# Patient Record
Sex: Female | Born: 1995 | Race: Black or African American | Hispanic: No | Marital: Single
Health system: Southern US, Community
[De-identification: ages and names within clinical notes are randomized; demographics above are authoritative.]

---

## 2018-04-02 ENCOUNTER — Emergency Department (HOSPITAL_COMMUNITY): Payer: Self-pay

## 2018-04-02 ENCOUNTER — Emergency Department (HOSPITAL_COMMUNITY)
Admission: EM | Admit: 2018-04-02 | Discharge: 2018-04-02 | Disposition: A | Discharge status: Home / Self Care | Payer: Self-pay | Attending: Emergency Medicine | Admitting: Emergency Medicine

## 2018-04-02 ENCOUNTER — Encounter (HOSPITAL_COMMUNITY): Payer: Self-pay | Admitting: Emergency Medicine

## 2018-04-02 ENCOUNTER — Other Ambulatory Visit: Payer: Self-pay

## 2018-04-02 DIAGNOSIS — Z23 Encounter for immunization: Secondary | ICD-10-CM | POA: Insufficient documentation

## 2018-04-02 DIAGNOSIS — Y929 Unspecified place or not applicable: Secondary | ICD-10-CM | POA: Insufficient documentation

## 2018-04-02 DIAGNOSIS — Y999 Unspecified external cause status: Secondary | ICD-10-CM | POA: Insufficient documentation

## 2018-04-02 DIAGNOSIS — S0181XA Laceration without foreign body of other part of head, initial encounter: Secondary | ICD-10-CM | POA: Insufficient documentation

## 2018-04-02 DIAGNOSIS — S0083XA Contusion of other part of head, initial encounter: Secondary | ICD-10-CM | POA: Insufficient documentation

## 2018-04-02 DIAGNOSIS — Y939 Activity, unspecified: Secondary | ICD-10-CM | POA: Insufficient documentation

## 2018-04-02 LAB — I-STAT BETA HCG BLOOD, ED (MC, WL, AP ONLY): I-stat hCG, quantitative: <5 m[IU]/mL (ref <5)

## 2018-04-02 MED ORDER — TETANUS-DIPHTH-ACELL PERTUSSIS 5-2.5-18.5 LF-MCG/0.5 IM SUSP
0.5000 mL | Freq: Once | INTRAMUSCULAR | Status: AC
  Administered 2018-04-02: 0.5 mL via INTRAMUSCULAR
  Filled 2018-04-02: qty 0.5

## 2018-04-02 MED ORDER — BACITRACIN ZINC 500 UNIT/GM EX OINT
TOPICAL_OINTMENT | CUTANEOUS | Status: AC
  Administered 2018-04-02: 23:00:00
  Filled 2018-04-02: qty 0.9

## 2018-04-02 MED ORDER — LIDOCAINE HCL (PF) 1 % IJ SOLN
10.0000 mL | Freq: Once | INTRAMUSCULAR | Status: AC
  Administered 2018-04-02: 10 mL
  Filled 2018-04-02: qty 30

## 2018-04-02 MED ORDER — HYDROCODONE-ACETAMINOPHEN 5-325 MG PO TABS
1.0000 | ORAL_TABLET | ORAL | Status: AC
  Administered 2018-04-02: 1 via ORAL
  Filled 2018-04-02: qty 1
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
## 2018-04-02 NOTE — ED Notes (Signed)
Spoke with Sane RN Melissa and she is on the way to speak with pt.

## 2018-04-02 NOTE — ED Notes (Signed)
Sane RN at bedside.

## 2018-04-02 NOTE — Discharge Instructions (Signed)
Suture removal in 5 days, apply ice to help with the swelling, take over the counter medications as needed for pain
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
## 2018-04-02 NOTE — ED Notes (Signed)
Pt verbalized discharge instructions and follow up care. SANE RN arranged all transportation for patient. Pt ambulatory and alert.

## 2018-04-02 NOTE — ED Provider Notes (Signed)
..  Laceration Repair Date/Time: 04/02/2018 10:03 PM Performed by: Versie StarksSeawell, Brittnie Lewey A, DO Authorized by: Linwood DibblesKnapp, Jon, MD   Consent:    Consent obtained:  Verbal   Consent given by:  Patient   Risks discussed:  Infection, poor cosmetic result and pain Anesthesia (see MAR for exact dosages):    Anesthesia method:  Local infiltration   Local anesthetic:  Lidocaine 1% WITH epi Laceration details:    Location:  Face   Face location:  L eyebrow   Length (cm):  2.5   Depth (mm):  3 Repair type:    Repair type:  Simple Pre-procedure details:    Preparation:  Patient was prepped and draped in usual sterile fashion Treatment:    Area cleansed with:  Betadine and saline   Amount of cleaning:  Standard   Irrigation solution:  Sterile saline Skin repair:    Repair method:  Sutures   Suture size:  6-0   Suture material:  Nylon   Suture technique:  Simple interrupted Approximation:    Approximation:  Close Post-procedure details:    Dressing:  Open (no dressing)   Patient tolerance of procedure:  Tolerated well, no immediate complications   Sage Kopera A, DO 04/02/2018, 10:07 PM Pager: [phone]     Versie StarksSeawell, Frutoso Dimare A, DO 04/02/18 2208    Linwood DibblesKnapp, Jon, MD 04/02/18 2351
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
## 2018-04-03 NOTE — SANE Note (Signed)
Follow-up Phone Call  Patient gives verbal consent for a FNE/SANE follow-up phone call in 48-72 hours: no Patient's telephone number: n/a Patient gives verbal consent to leave voicemail at the phone number listed above:No DO NOT CALL between the hours of: no consent to call
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
## 2020-11-14 ENCOUNTER — Inpatient Hospital Stay: Admit: 2020-11-14 | Discharge: 2020-11-14 | Disposition: A | Discharge status: Home / Self Care | Payer: MEDICAID

## 2020-11-14 ENCOUNTER — Emergency Department: Admit: 2020-11-14 | Payer: MEDICAID

## 2020-11-14 DIAGNOSIS — R1031 Right lower quadrant pain: Secondary | ICD-10-CM

## 2020-11-14 LAB — CBC WITH AUTO DIFFERENTIAL
Basophils %: 0.7 %
Basophils Absolute: 0 10*3/uL (ref 0.0–0.2)
Eosinophils %: 1.7 %
Eosinophils Absolute: 0.1 10*3/uL (ref 0.0–0.6)
Hematocrit: 38.8 % (ref 36.0–48.0)
Hemoglobin: 12.6 g/dL (ref 12.0–16.0)
Lymphocytes %: 35.2 %
Lymphocytes Absolute: 2 10*3/uL (ref 1.0–5.1)
MCH: 25.8 pg — ABNORMAL LOW (ref 26.0–34.0)
MCHC: 32.5 g/dL (ref 31.0–36.0)
MCV: 79.4 fL — ABNORMAL LOW (ref 80.0–100.0)
MPV: 8.3 fL (ref 5.0–10.5)
Monocytes %: 10.8 %
Monocytes Absolute: 0.6 10*3/uL (ref 0.0–1.3)
Neutrophils %: 51.6 %
Neutrophils Absolute: 2.9 10*3/uL (ref 1.7–7.7)
Platelets: 303 10*3/uL (ref 135–450)
RBC: 4.88 M/uL (ref 4.00–5.20)
RDW: 15.1 % (ref 12.4–15.4)
WBC: 5.6 10*3/uL (ref 4.0–11.0)

## 2020-11-14 LAB — URINALYSIS WITH REFLEX TO CULTURE
Bilirubin Urine: NEGATIVE
Glucose, Ur: NEGATIVE mg/dL
Ketones, Urine: NEGATIVE mg/dL
Nitrite, Urine: NEGATIVE
Protein, UA: 30 mg/dL — AB
Specific Gravity, UA: 1.021 (ref 1.005–1.030)
Urobilinogen, Urine: 0.2 E.U./dL (ref <2.0)
pH, UA: 6 (ref 5.0–8.0)

## 2020-11-14 LAB — HEPATIC FUNCTION PANEL
ALT: 17 U/L (ref 10–40)
AST: 12 U/L — ABNORMAL LOW (ref 15–37)
Albumin: 3.2 g/dL — ABNORMAL LOW (ref 3.4–5.0)
Alkaline Phosphatase: 85 U/L (ref 40–129)
Bilirubin, Direct: <0.2 mg/dL (ref 0.0–0.3)
Total Bilirubin: <0.2 mg/dL (ref 0.0–1.0)
Total Protein: 7.3 g/dL (ref 6.4–8.2)

## 2020-11-14 LAB — MICROSCOPIC URINALYSIS
Bacteria, UA: NONE SEEN /HPF
Epithelial Cells, UA: 8 /HPF — ABNORMAL HIGH (ref 0–5)
Hyaline Casts, UA: 0 /LPF (ref 0–8)
RBC, UA: 8 /HPF — ABNORMAL HIGH (ref 0–4)
WBC, UA: 5 /HPF (ref 0–5)

## 2020-11-14 LAB — BASIC METABOLIC PANEL
Anion Gap: 11 (ref 3–16)
BUN: 13 mg/dL (ref 7–20)
CO2: 22 mmol/L (ref 21–32)
Calcium: 9 mg/dL (ref 8.3–10.6)
Chloride: 103 mmol/L (ref 99–110)
Creatinine: 0.8 mg/dL (ref 0.6–1.1)
GFR African American: >60 (ref >60)
GFR Non-African American: >60 (ref >60)
Glucose: 98 mg/dL (ref 70–99)
Potassium: 4.2 mmol/L (ref 3.5–5.1)
Sodium: 136 mmol/L (ref 136–145)

## 2020-11-14 LAB — LIPASE: Lipase: 16 U/L (ref 13.0–60.0)

## 2020-11-14 LAB — PREGNANCY, URINE: HCG(Urine) Pregnancy Test: NEGATIVE

## 2020-11-14 MED ORDER — ONDANSETRON HCL 4 MG/2ML IJ SOLN
4 MG/2ML | Freq: Once | INTRAMUSCULAR | Status: DC
Start: 2020-11-14 — End: 2020-11-14

## 2020-11-14 MED ORDER — SODIUM CHLORIDE 0.9 % IV BOLUS
0.9 % | Freq: Once | INTRAVENOUS | Status: AC
Start: 2020-11-14 — End: 2020-11-14
  Administered 2020-11-14: 15:00:00 1000 mL via INTRAVENOUS

## 2020-11-14 MED ORDER — MORPHINE SULFATE (PF) 4 MG/ML IV SOLN
4 MG/ML | Freq: Once | INTRAVENOUS | Status: AC
Start: 2020-11-14 — End: 2020-11-14
  Administered 2020-11-14: 15:00:00 4 mg via INTRAVENOUS

## 2020-11-14 MED ORDER — IOPAMIDOL 76 % IV SOLN
76 % | Freq: Once | INTRAVENOUS | Status: AC | PRN
Start: 2020-11-14 — End: 2020-11-14
  Administered 2020-11-14: 16:00:00 75 mL via INTRAVENOUS

## 2020-11-14 MED ORDER — ONDANSETRON HCL 4 MG/2ML IJ SOLN
4 MG/2ML | Freq: Once | INTRAMUSCULAR | Status: AC
Start: 2020-11-14 — End: 2020-11-14
  Administered 2020-11-14: 15:00:00 4 mg via INTRAVENOUS

## 2020-11-14 MED FILL — MORPHINE SULFATE 4 MG/ML IV SOLN: 4 mg/mL | INTRAVENOUS | Qty: 1

## 2020-11-14 MED FILL — ONDANSETRON HCL 4 MG/2ML IJ SOLN: 4 MG/2ML | INTRAMUSCULAR | Qty: 2

## 2020-11-14 NOTE — ED Provider Notes (Signed)
[facility]  [facility] EMERGENCY DEPARTMENT  [address] [place] [address]  Dept: [phone]  Loc: [phone]    EMERGENCY DEPARTMENT ENCOUNTER        This patient was not seen or evaluated by the attending physician.    I evaluated this patient, the attending physician was available for consultation.    CHIEF COMPLAINT    Chief Complaint   Patient presents with    Abdominal Pain       HPI    Ellen Yoder is a 25 y.o. female who presents to the emergency department with lower quadrant abdominal pain and more specifically right lower abdominal pain for the last 1 week after lifting heavy boxes at work.  She states the pain has just gotten so severe that its caused her to feel like she might pass out and she is becoming nauseated.  She denies body aches, fever, chills, vomiting, diarrhea, constipation, urinary symptoms.  She denies concern for STD as she has not been sexually active in the last 2-1/2 years.    REVIEW OF SYSTEMS    GI: see HPI, no vomiting, no diarrhea, no hematochezia  Cardiac: No chest pain, shortness of breath, palpitations or syncope  Pulmonary: No difficulty breathing or new cough  General: No fevers  GU: No dysuria, No hematuria  See HPI for further details.   All other systems reviewed and are negative.    PAST MEDICAL & SURGICAL HISTORY    History reviewed. No pertinent past medical history.  Past Surgical History:   Procedure Laterality Date    APPENDECTOMY         CURRENT MEDICATIONS  (may include discharge medications prescribed in the ED)      ALLERGIES    No Known Allergies    SOCIAL AND FAMILY HISTORY    Social History     Socioeconomic History    Marital status: Single     Spouse name: None    Number of children: None    Years of education: None    Highest education level: None   Tobacco Use    Smoking status: Every Day     Packs/day: 0.50     Types: Cigarettes   Substance and Sexual Activity    Drug use: Never    Sexual activity: Not  Currently     History reviewed. No pertinent family history.    PHYSICAL EXAM    VITAL SIGNS: BP (!) 121/96    Pulse 70    Temp 98 ??F (36.7 ??C) (Oral)    Resp 16    Ht 5\' 4"  (1.626 m)    Wt (!) 316 lb 9.3 oz (143.6 kg)    LMP 11/10/2020    SpO2 100%    BMI 54.34 kg/m??   Constitutional:  Well developed, well nourished, no acute distress  Eyes:  Sclera nonicteric, conjunctiva moist  HENT:  Atraumatic, nose normal  Neck: no JVD  Respiratory:  No retractions, no accessory muscle use, normal breath sounds   Cardiovascular: regular rate, no murmurs  GI:  +RLQ abdominal tenderness to palpation, soft, no guarding, bowel sounds present, no audible bruits or palpable pulsatile masses  Back: no CVA tenderness  Musculoskeletal:  No edema, no acute deformities  Vascular: DP pulses 2+ and equal bilaterally  Integument: No rashes, skin dry  Neurologic:  Alert & oriented, no slurred speech  Psychiatric: Cooperative, pleasant affect     RADIOLOGY/PROCEDURES    11/12/2020  PELVIS COMPLETE   Preliminary Result   Essentially unremarkable pelvic ultrasound.         [place] NON OB TRANSVAGINAL   Preliminary Result   Essentially unremarkable pelvic ultrasound.         CT ABDOMEN PELVIS W IV CONTRAST Additional Contrast? None   Final Result   No acute abdominopelvic abnormality.           Labs Reviewed   CBC WITH AUTO DIFFERENTIAL - Abnormal; Notable for the following components:       Result Value    MCV 79.4 (*)     MCH 25.8 (*)     All other components within normal limits   HEPATIC FUNCTION PANEL - Abnormal; Notable for the following components:    Albumin 3.2 (*)     AST 12 (*)     All other components within normal limits   URINALYSIS WITH REFLEX TO CULTURE - Abnormal; Notable for the following components:    Clarity, UA CLOUDY (*)     Blood, Urine MODERATE (*)     Protein, UA 30 (*)     Leukocyte Esterase, Urine SMALL (*)     All other components within normal limits   MICROSCOPIC URINALYSIS - Abnormal; Notable for the following components:     RBC, UA 8 (*)     Epithelial Cells, UA 8 (*)     All other components within normal limits   BASIC METABOLIC PANEL   LIPASE   PREGNANCY, URINE       ED COURSE & MEDICAL DECISION MAKING    Pertinent Labs & Imaging studies reviewed and interpreted. (See chart for details)     See chart for details of medications given during the ED stay.    Vitals:    11/14/20 1007 11/14/20 1100 11/14/20 1115 11/14/20 1145   BP: (!) 135/99 122/80 132/85 (!) 121/96   Pulse: 93  (!) 109 70   Resp: 16  21 16    Temp: 98 ??F (36.7 ??C)      TempSrc: Oral      SpO2: 98%  100% 100%   Weight: (!) 316 lb 9.3 oz (143.6 kg)      Height: 5\' 4"  (1.626 m)        Medications   0.9 % sodium chloride bolus (1,000 mLs IntraVENous New Bag 11/14/20 1101)   morphine (PF) injection 4 mg (4 mg IntraVENous Given 11/14/20 1102)   ondansetron (ZOFRAN) injection 4 mg (4 mg IntraVENous Given 11/14/20 1101)   iopamidol (ISOVUE-370) 76 % injection 75 mL (75 mLs IntraVENous Given 11/14/20 1159)     I have seen and evaluated this patient.  My attending physician was available for consultation.    Differential diagnosis includes was not limited to ovarian torsion, colitis, diverticulitis, PID, urinary tract infection, pyelonephritis, ischemic bowel, hernia, muscle strain, other    She is nontoxic in appearance and hemodynamically stable.  She does appear uncomfortable during my initial evaluation with her.    White count is 5.6.  No anemia.  BMP is unremarkable.  Hepatic function is unremarkable.    She is not pregnant.  Urinalysis is with negative nitrates with small leukocytes.  She is afebrile.    CT scan of the abdomen pelvis with IV contrast shows no acute abdominopelvic abnormality.    Non-OB pelvic ultrasound shows unremarkable pelvic ultrasound.    She was given fluids, 1 round of morphine and 1 dose of Zofran.  On reevaluation patient states that  she is feeling much better.  She is eating [employer] at the bedside with her partner.    I think at this time it is  safe to discharge her home.  She was given a [place] PCP referral for follow-up.  Her work-up is reassuring today.  She was instructed to always return to the emergency department for worsening symptoms.  Patient verbalized understanding of the discharge instructions.    FINAL IMPRESSION    1. Lower abdominal pain        PLAN  Discharge with outpatient follow-up    (Please note that this note was completed with a voice recognition program.  Every attempt was made to edit the dictations, but inevitably there remain words that are mis-transcribed.)        Walker Shadow, APRN - CNP  11/14/20 1351

## 2020-11-14 NOTE — ED Triage Notes (Signed)
Pt complains of abdominal pain for about one week. The pain started when she lifted a box at work. Reports that the pain is where the scar is from her appendectomy.

## 2020-11-18 ENCOUNTER — Encounter: Payer: MEDICAID | Attending: Family
